# Patient Record
Sex: Male | Born: 1970 | Hispanic: No | Marital: Married | Smoking: Never smoker
Health system: Southern US, Community
[De-identification: ages and names within clinical notes are randomized; demographics above are authoritative.]

## PROBLEM LIST (undated history)

## (undated) DIAGNOSIS — E119 Type 2 diabetes mellitus without complications: Secondary | ICD-10-CM

## (undated) DIAGNOSIS — I10 Essential (primary) hypertension: Secondary | ICD-10-CM

## (undated) DIAGNOSIS — E785 Hyperlipidemia, unspecified: Secondary | ICD-10-CM

## (undated) DIAGNOSIS — K219 Gastro-esophageal reflux disease without esophagitis: Secondary | ICD-10-CM

## (undated) HISTORY — PX: HERNIA REPAIR: SHX51

## (undated) HISTORY — PX: APPENDECTOMY: SHX54

---

## 2019-06-06 ENCOUNTER — Other Ambulatory Visit: Payer: Self-pay

## 2019-06-06 ENCOUNTER — Emergency Department (HOSPITAL_BASED_OUTPATIENT_CLINIC_OR_DEPARTMENT_OTHER)
Admission: EM | Admit: 2019-06-06 | Discharge: 2019-06-06 | Disposition: A | Discharge status: Home / Self Care | Payer: BC Managed Care – PPO | Attending: Emergency Medicine | Admitting: Emergency Medicine

## 2019-06-06 ENCOUNTER — Emergency Department (HOSPITAL_BASED_OUTPATIENT_CLINIC_OR_DEPARTMENT_OTHER): Payer: BC Managed Care – PPO

## 2019-06-06 ENCOUNTER — Encounter (HOSPITAL_BASED_OUTPATIENT_CLINIC_OR_DEPARTMENT_OTHER): Payer: Self-pay | Admitting: Emergency Medicine

## 2019-06-06 DIAGNOSIS — W19XXXA Unspecified fall, initial encounter: Secondary | ICD-10-CM

## 2019-06-06 DIAGNOSIS — S299XXA Unspecified injury of thorax, initial encounter: Secondary | ICD-10-CM | POA: Insufficient documentation

## 2019-06-06 DIAGNOSIS — I10 Essential (primary) hypertension: Secondary | ICD-10-CM | POA: Diagnosis not present

## 2019-06-06 DIAGNOSIS — E785 Hyperlipidemia, unspecified: Secondary | ICD-10-CM | POA: Diagnosis not present

## 2019-06-06 DIAGNOSIS — W010XXA Fall on same level from slipping, tripping and stumbling without subsequent striking against object, initial encounter: Secondary | ICD-10-CM | POA: Diagnosis not present

## 2019-06-06 DIAGNOSIS — Y9289 Other specified places as the place of occurrence of the external cause: Secondary | ICD-10-CM | POA: Insufficient documentation

## 2019-06-06 DIAGNOSIS — Y9389 Activity, other specified: Secondary | ICD-10-CM | POA: Insufficient documentation

## 2019-06-06 DIAGNOSIS — Y998 Other external cause status: Secondary | ICD-10-CM | POA: Diagnosis not present

## 2019-06-06 HISTORY — DX: Hyperlipidemia, unspecified: E78.5

## 2019-06-06 HISTORY — DX: Gastro-esophageal reflux disease without esophagitis: K21.9

## 2019-06-06 HISTORY — DX: Essential (primary) hypertension: I10

## 2019-06-06 MED ORDER — NAPROXEN 500 MG PO TABS: 500.0000 mg | ORAL_TABLET | Freq: Two times a day (BID) | ORAL | 0 refills | Status: DC

## 2019-06-06 MED ORDER — ACETAMINOPHEN 500 MG PO TABS
1000.0000 mg | ORAL_TABLET | Freq: Once | ORAL | Status: AC
  Administered 2019-06-06: 1000 mg via ORAL
  Filled 2019-06-06: qty 2

## 2019-06-06 MED ORDER — NAPROXEN 250 MG PO TABS
500.0000 mg | ORAL_TABLET | Freq: Once | ORAL | Status: AC
  Administered 2019-06-06: 500 mg via ORAL
  Filled 2019-06-06: qty 2

## 2019-06-06 NOTE — ED Notes (Signed)
ED Provider at bedside. 

## 2019-06-06 NOTE — ED Notes (Signed)
Patient transported to X-ray 

## 2019-06-06 NOTE — ED Provider Notes (Signed)
[facility] EMERGENCY DEPARTMENT Provider Note   CSN: [id number] Arrival date & time: 06/06/19  0035     History   Chief Complaint Chief Complaint  Patient presents with  . Fall    HPI Wayne Curry is a 48 y.o. male.     The history is provided by the patient.  Fall This is a new problem. The current episode started 3 to 5 hours ago. The problem occurs constantly. The problem has not changed since onset.Pertinent negatives include no chest pain, no abdominal pain, no headaches and no shortness of breath. Associated symptoms comments: Right rib pain. Nothing aggravates the symptoms. Nothing relieves the symptoms. He has tried nothing for the symptoms. The treatment provided no relief.  Patient fell into rounded area of wall as pbaby was trying to get out of crib.  Did not hit head.  No LOC.  Right rib pain.    Past Medical History:  Diagnosis Date  . GERD (gastroesophageal reflux disease)   . Hyperlipidemia   . Hypertension     There are no active problems to display for this patient.   Past Surgical History:  Procedure Laterality Date  . APPENDECTOMY    . HERNIA REPAIR          Home Medications    Prior to Admission medications   Medication Sig Start Date End Date Taking? Authorizing Provider  esomeprazole (NEXIUM) 40 MG capsule Take 40 mg by mouth daily at 12 noon.   Yes [provider]  naproxen (NAPROSYN) 500 MG tablet Take 1 tablet (500 mg total) by mouth 2 (two) times daily with a meal. 06/06/19   Ritter Helsley, MD    Family History No family history on file.  Social History Social History   Tobacco Use  . Smoking status: Never Smoker  . Smokeless tobacco: Current User    Types: Snuff  Substance Use Topics  . Alcohol use: Never    Frequency: Never  . Drug use: Never     Allergies   Patient has no known allergies.   Review of Systems Review of Systems  Constitutional: Negative for fever.  HENT: Negative for  congestion.   Eyes: Negative for visual disturbance.  Respiratory: Negative for shortness of breath.   Cardiovascular: Negative for chest pain.  Gastrointestinal: Negative for abdominal pain.  Genitourinary: Negative for difficulty urinating.  Musculoskeletal: Positive for arthralgias.  Neurological: Negative for headaches.  Psychiatric/Behavioral: Negative for agitation.  All other systems reviewed and are negative.    Physical Exam Updated Vital Signs BP 119/82   Pulse 64   Temp 98.7 F (37.1 C) (Oral)   Resp 16   Ht 5\' 8"  (1.727 m)   Wt 77.1 kg   SpO2 98%   BMI 25.85 kg/m   Physical Exam Vitals signs and nursing note reviewed.  Constitutional:      General: He is not in acute distress.    Appearance: He is normal weight.  HENT:     Head: Normocephalic and atraumatic.     Nose: Nose normal.  Eyes:     Conjunctiva/sclera: Conjunctivae normal.     Pupils: Pupils are equal, round, and reactive to light.  Neck:     Musculoskeletal: Normal range of motion and neck supple.  Cardiovascular:     Rate and Rhythm: Normal rate and regular rhythm.     Pulses: Normal pulses.     Heart sounds: Normal heart sounds.  Pulmonary:     Effort: Pulmonary effort  is normal.     Breath sounds: Normal breath sounds.  Abdominal:     General: Abdomen is flat. Bowel sounds are normal.     Tenderness: There is no abdominal tenderness. There is no guarding.  Musculoskeletal: Normal range of motion.  Skin:    General: Skin is warm and dry.     Capillary Refill: Capillary refill takes less than 2 seconds.  Neurological:     General: No focal deficit present.     Mental Status: He is alert and oriented to person, place, and time.  Psychiatric:        Mood and Affect: Mood normal.        Behavior: Behavior normal.      ED Treatments / Results  Labs (all labs ordered are listed, but only abnormal results are displayed) Labs Reviewed - No data to display  EKG None  Radiology Dg  Ribs Unilateral W/chest Right  Result Date: 06/06/2019 CLINICAL DATA:  48 year old male with fall and right chest pain. EXAM: RIGHT RIBS AND CHEST - 3+ VIEW COMPARISON:  None. FINDINGS: The lungs are clear. There is no pleural effusion or pneumothorax. A 9 mm nodular density at the right lung base, likely nipple shadow. The cardiac silhouette is within normal limits. No acute osseous pathology. No displaced rib fractures. IMPRESSION: 1. No acute cardiopulmonary process. 2. No displaced rib fracture. Electronically Signed   By: Anner Crete M.D.   On: 06/06/2019 01:10    Procedures Procedures (including critical care time)  Medications Ordered in ED Medications  acetaminophen (TYLENOL) tablet 1,000 mg (1,000 mg Oral Given 06/06/19 0102)  naproxen (NAPROSYN) tablet 500 mg (500 mg Oral Given 06/06/19 0102)     Ice applied to ribs.  No palpable deformity.  No fracture on Xray.  Continue Ice and NSAIDs.    Final Clinical Impressions(s) / ED Diagnoses   Final diagnoses:  Fall, initial encounter   Return for intractable cough, coughing up blood,fevers >100.4 unrelieved by medication, shortness of breath, intractable vomiting, chest pain, shortness of breath, weakness,numbness, changes in speech, facial asymmetry,abdominal pain, passing out,Inability to tolerate liquids or food, cough, altered mental status or any concerns. No signs of systemic illness or infection. The patient is nontoxic-appearing on exam and vital signs are within normal limits.   I have reviewed the triage vital signs and the nursing notes. Pertinent labs &imaging results that were available during my care of the patient were reviewed by me and considered in my medical decision making (see chart for details).  After history, exam, and medical workup I feel the patient has been appropriately medically screened and is safe for discharge home. Pertinent diagnoses were discussed with the patient. Patient was given  return precautions ED Discharge Orders         Ordered    naproxen (NAPROSYN) 500 MG tablet  2 times daily with meals     06/06/19 0116           Helma Argyle, MD 06/06/19 2426

## 2019-06-06 NOTE — ED Triage Notes (Signed)
Pt fell at home and hit right side ribs on bed. Pt c/o right anterior rib pain. Pt has ice on area on arrival.

## 2021-08-16 ENCOUNTER — Emergency Department (HOSPITAL_BASED_OUTPATIENT_CLINIC_OR_DEPARTMENT_OTHER)
Admission: EM | Admit: 2021-08-16 | Discharge: 2021-08-16 | Disposition: A | Discharge status: Home / Self Care | Payer: BC Managed Care – PPO | Attending: Emergency Medicine | Admitting: Emergency Medicine

## 2021-08-16 ENCOUNTER — Other Ambulatory Visit: Payer: Self-pay

## 2021-08-16 ENCOUNTER — Encounter (HOSPITAL_BASED_OUTPATIENT_CLINIC_OR_DEPARTMENT_OTHER): Payer: Self-pay | Admitting: Emergency Medicine

## 2021-08-16 ENCOUNTER — Emergency Department (HOSPITAL_BASED_OUTPATIENT_CLINIC_OR_DEPARTMENT_OTHER): Payer: BC Managed Care – PPO

## 2021-08-16 DIAGNOSIS — J101 Influenza due to other identified influenza virus with other respiratory manifestations: Secondary | ICD-10-CM | POA: Diagnosis not present

## 2021-08-16 DIAGNOSIS — F17220 Nicotine dependence, chewing tobacco, uncomplicated: Secondary | ICD-10-CM | POA: Diagnosis not present

## 2021-08-16 DIAGNOSIS — I10 Essential (primary) hypertension: Secondary | ICD-10-CM | POA: Diagnosis not present

## 2021-08-16 DIAGNOSIS — E119 Type 2 diabetes mellitus without complications: Secondary | ICD-10-CM | POA: Diagnosis not present

## 2021-08-16 DIAGNOSIS — R509 Fever, unspecified: Secondary | ICD-10-CM | POA: Diagnosis present

## 2021-08-16 HISTORY — DX: Type 2 diabetes mellitus without complications: E11.9

## 2021-08-16 LAB — RAPID INFLUENZA A&B ANTIGENS
Influenza A (ARMC): POSITIVE — AB
Influenza B (ARMC): NEGATIVE

## 2021-08-16 MED ORDER — BENZONATATE 100 MG PO CAPS: 100.0000 mg | ORAL_CAPSULE | Freq: Three times a day (TID) | ORAL | 0 refills | Status: DC

## 2021-08-16 MED ORDER — ACETAMINOPHEN 500 MG PO TABS
1000.0000 mg | ORAL_TABLET | Freq: Once | ORAL | Status: AC | PRN
  Administered 2021-08-16: 1000 mg via ORAL
  Filled 2021-08-16: qty 2

## 2021-08-16 NOTE — ED Provider Notes (Signed)
[facility] EMERGENCY DEPARTMENT Provider Note   CSN: [id number] Arrival date & time: 08/16/21  1812     History Chief Complaint  Patient presents with   Fever    Ballard Budney is a 50 y.o. male.  Patient with history of diabetes, hypertension presents today with chief complaint of fever and cough.  He states that his symptoms have been ongoing for 3 days.  He endorses fevers at maximum 104 for which she has been taking Tylenol with success.  He also endorses associated congestion and sinus pressure.  He has not tried anything for the symptoms.  He denies chest pain, shortness of breath, or sore throat.  No known sick contacts.  The history is provided by the patient. No language interpreter was used.  Fever Associated symptoms: congestion, cough, headaches, myalgias and rhinorrhea   Associated symptoms: no chest pain, no chills, no confusion, no diarrhea, no ear pain, no nausea, no rash, no sore throat and no vomiting       Past Medical History:  Diagnosis Date   Diabetes mellitus without complication (HCC)    GERD (gastroesophageal reflux disease)    Hyperlipidemia    Hypertension     There are no problems to display for this patient.   Past Surgical History:  Procedure Laterality Date   APPENDECTOMY     HERNIA REPAIR         History reviewed. No pertinent family history.  Social History   Tobacco Use   Smoking status: Never   Smokeless tobacco: Current    Types: Snuff  Substance Use Topics   Alcohol use: Never   Drug use: Never    Home Medications Prior to Admission medications   Medication Sig Start Date End Date Taking? Authorizing Provider  esomeprazole (NEXIUM) 40 MG capsule Take 40 mg by mouth daily at 12 noon.    [provider]  naproxen (NAPROSYN) 500 MG tablet Take 1 tablet (500 mg total) by mouth 2 (two) times daily with a meal. 06/06/19   Palumbo, April, MD    Allergies    Patient has no known allergies.  Review of  Systems   Review of Systems  Constitutional:  Positive for fever. Negative for chills.  HENT:  Positive for congestion, rhinorrhea and sinus pressure. Negative for ear discharge, ear pain, mouth sores, nosebleeds, sinus pain, sore throat, trouble swallowing and voice change.   Eyes:  Negative for pain.  Respiratory:  Positive for cough and chest tightness. Negative for apnea, choking, shortness of breath, wheezing and stridor.   Cardiovascular:  Negative for chest pain.  Gastrointestinal:  Negative for diarrhea, nausea and vomiting.  Musculoskeletal:  Positive for myalgias. Negative for neck pain and neck stiffness.  Skin:  Negative for rash.  Neurological:  Positive for headaches. Negative for dizziness, tremors, seizures, syncope, facial asymmetry, speech difficulty, weakness, light-headedness and numbness.  Psychiatric/Behavioral:  Negative for confusion and decreased concentration.   All other systems reviewed and are negative.  Physical Exam Updated Vital Signs BP 111/83 (BP Location: Left Arm)   Pulse 88   Temp 99.2 F (37.3 C) (Oral)   Resp 16   Ht 5\' 8"  (1.727 m)   Wt 73.5 kg   SpO2 98%   BMI 24.63 kg/m   Physical Exam Vitals and nursing note reviewed.  Constitutional:      General: He is not in acute distress.    Appearance: Normal appearance. He is normal weight. He is not ill-appearing, toxic-appearing or diaphoretic.  Comments: Patient sitting comfortably in chair well-appearing in no apparent distress.  HENT:     Head: Normocephalic and atraumatic.     Nose: Nose normal.     Mouth/Throat:     Mouth: Mucous membranes are moist.  Eyes:     Extraocular Movements: Extraocular movements intact.     Conjunctiva/sclera: Conjunctivae normal.     Pupils: Pupils are equal, round, and reactive to light.  Cardiovascular:     Rate and Rhythm: Normal rate and regular rhythm.     Heart sounds: Normal heart sounds.  Pulmonary:     Effort: Pulmonary effort is normal.      Breath sounds: Normal breath sounds.  Abdominal:     General: Abdomen is flat.     Palpations: Abdomen is soft.  Musculoskeletal:        General: Normal range of motion.     Cervical back: Normal range of motion and neck supple.  Skin:    General: Skin is warm and dry.  Neurological:     General: No focal deficit present.     Mental Status: He is alert.  Psychiatric:        Mood and Affect: Mood normal.        Behavior: Behavior normal.    ED Results / Procedures / Treatments   Labs (all labs ordered are listed, but only abnormal results are displayed) Labs Reviewed  RAPID INFLUENZA A&B ANTIGENS - Abnormal; Notable for the following components:      Result Value   Influenza A ([facility]) POSITIVE (*)    All other components within normal limits    EKG None  Radiology DG Chest 2 View  Result Date: 08/16/2021 CLINICAL DATA:  Cough, fever and congestion times 3-4 days. Flu positive. EXAM: CHEST - 2 VIEW COMPARISON:  June 06, 2019 rib radiograph. FINDINGS: The heart size and mediastinal contours are within normal limits. No focal consolidation. No pleural effusion. No pneumothorax. The visualized skeletal structures are unremarkable. IMPRESSION: No acute cardiopulmonary process. Electronically Signed   By: Maudry Mayhew M.D.   On: 08/16/2021 19:44    Procedures Procedures   Medications Ordered in ED Medications  acetaminophen (TYLENOL) tablet 1,000 mg (1,000 mg Oral Given 08/16/21 1840)    ED Course  I have reviewed the triage vital signs and the nursing notes.  Pertinent labs & imaging results that were available during my care of the patient were reviewed by me and considered in my medical decision making (see chart for details).    MDM Rules/Calculators/A&P                         Patient positive for influenza A.  Symptoms are consistent with upper respiratory infection.  Lungs clear to auscultation in all fields, no labs or chest x-ray indicated.  He is awake, alert,  speaking in complete sentences, in no acute distress.  Fever successfully managed with Tylenol.  Discussed that antibiotics are not indicated for viral infections. Pt will be discharged with symptomatic treatment.  Verbalizes understanding and is agreeable with plan. Pt is hemodynamically stable & in NAD prior to dc.   Final Clinical Impression(s) / ED Diagnoses Final diagnoses:  Influenza A    Rx / DC Orders ED Discharge Orders          Ordered    benzonatate (TESSALON) 100 MG capsule  Every 8 hours        08/16/21 2328  An After Visit Summary was printed and given to the patient.    Vear Clock 08/16/21 2328    Vanetta Mulders, MD 08/28/21 [id number]

## 2021-08-16 NOTE — Discharge Instructions (Addendum)
You were diagnosed with influenza A in the emergency department this evening.  Her symptoms are consistent with an upper respiratory infection, antibiotics are not indicated for this.  Managed with supportive care including plenty of hydration, Tylenol/ibuprofen scheduled for fevers.  Additionally you may get Mucinex D (orange box) from behind the counter at your pharmacy for additional symptomatic treatment.   Return if development of new or worsening symptoms.

## 2021-08-16 NOTE — ED Triage Notes (Signed)
Pt arrives pov with c/o fever, cough body aches and sore throat x 3 days

## 2022-05-06 IMAGING — CR DG CHEST 2V
2 series · 2 of 2 positions shown · non-contrast
Comparison: June 06, 2019 rib radiograph.

CLINICAL DATA: Cough, fever and congestion times 3-4 days. Flu
positive.

EXAM:
CHEST - 2 VIEW

[w chest pa]
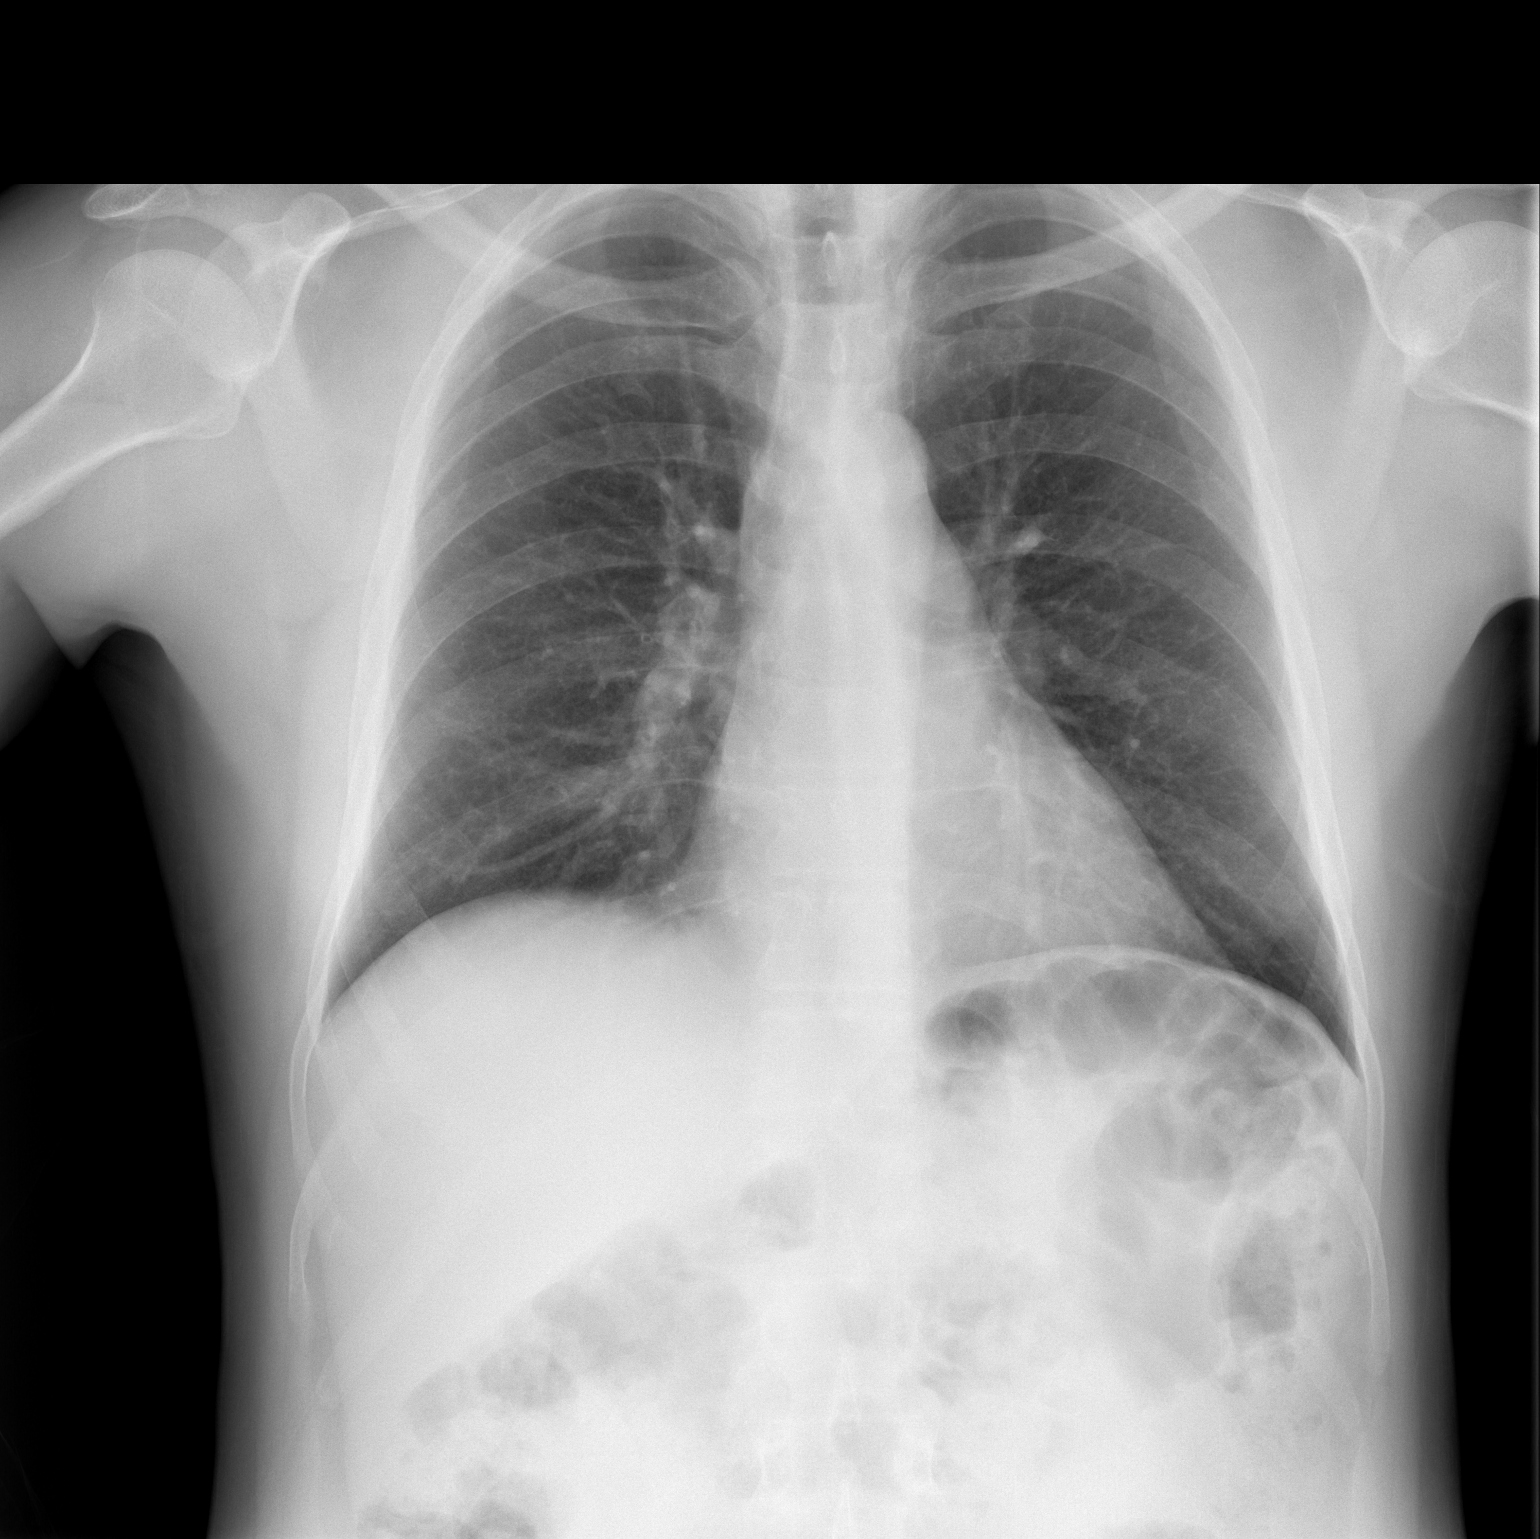

[w chest lat]
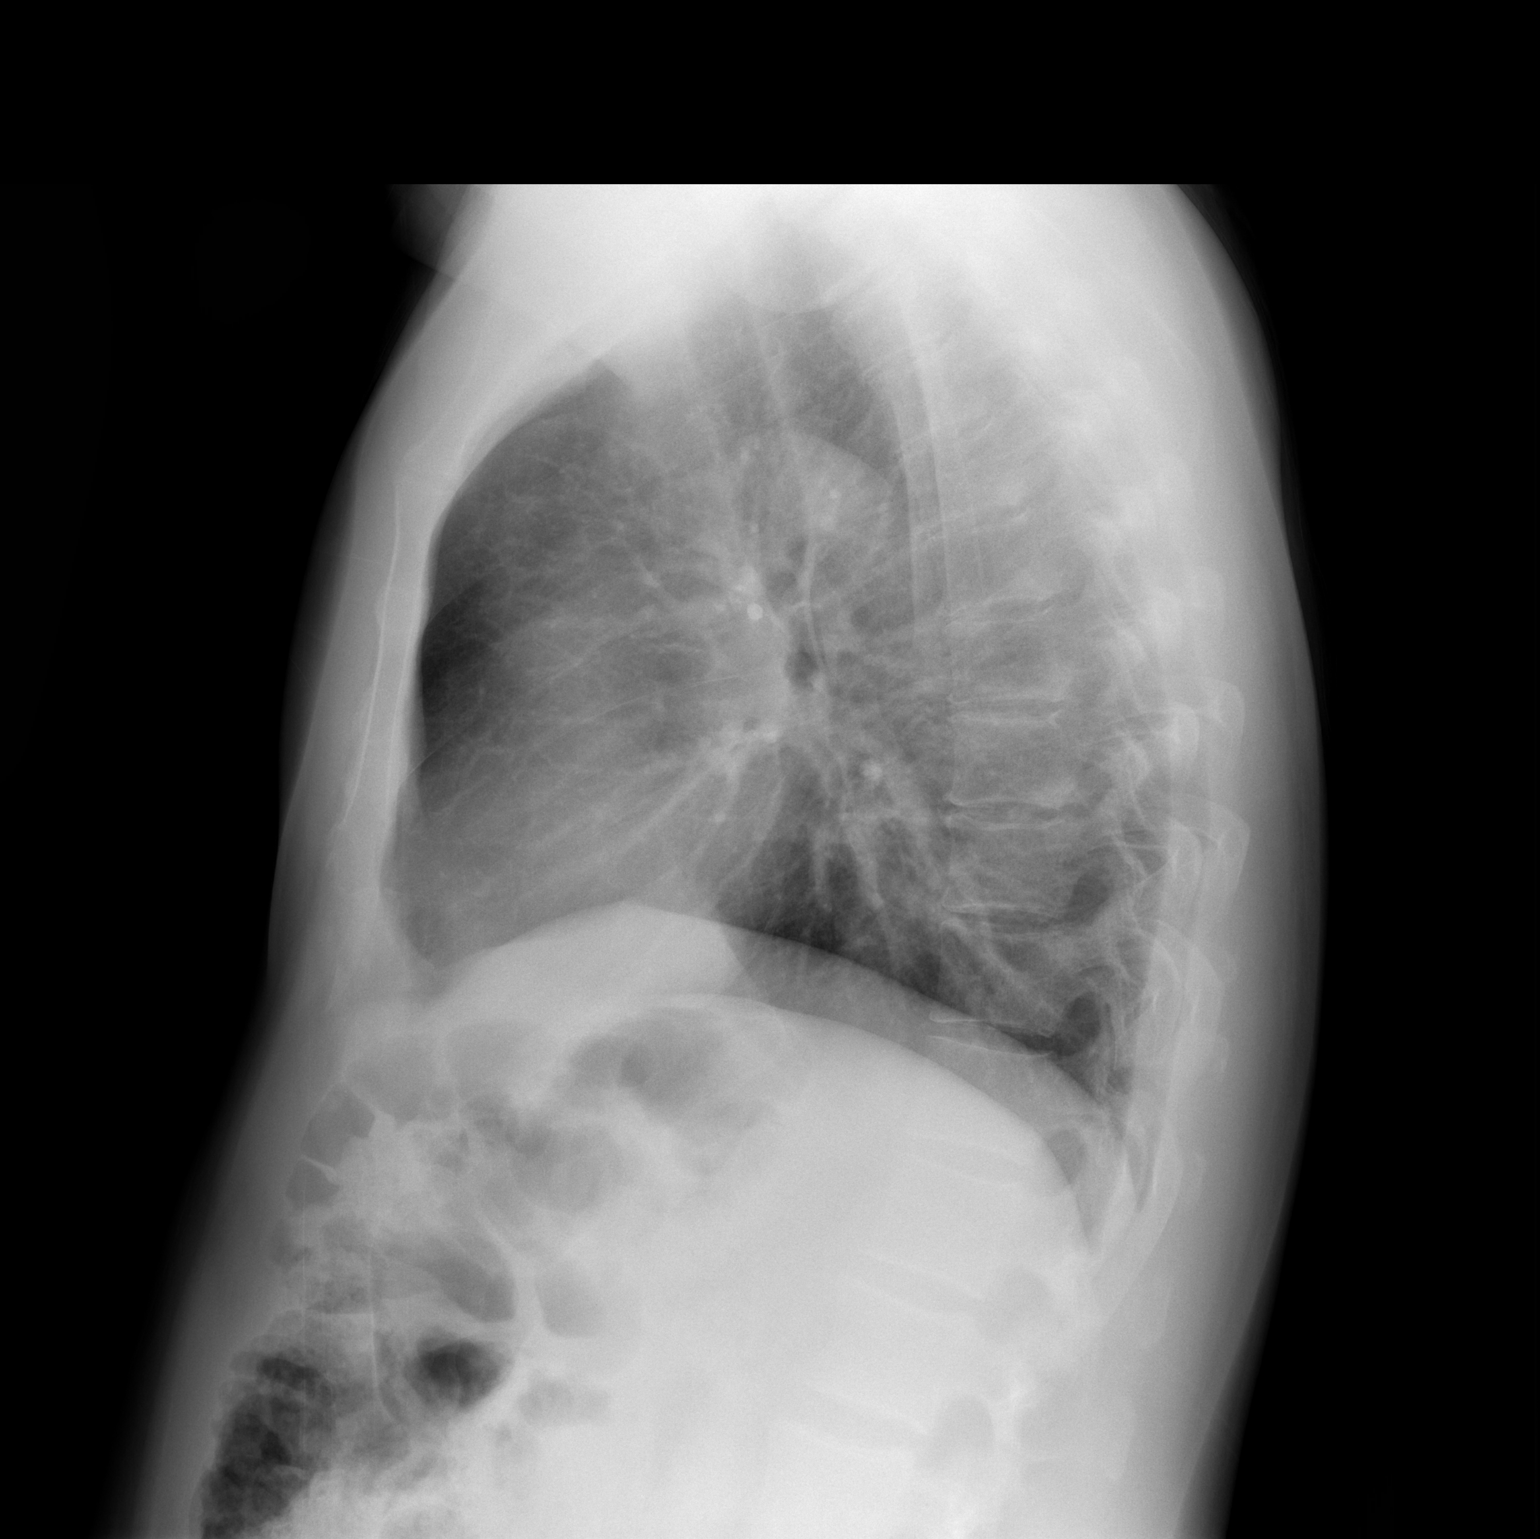

[2 of 2 positions shown; findings below may reference images not displayed]

FINDINGS: The heart size and mediastinal contours are within normal limits. No
focal consolidation. No pleural effusion. No pneumothorax. The
visualized skeletal structures are unremarkable.
IMPRESSION: No acute cardiopulmonary process.

## 2023-02-28 ENCOUNTER — Encounter (HOSPITAL_BASED_OUTPATIENT_CLINIC_OR_DEPARTMENT_OTHER): Payer: Self-pay | Admitting: Orthopaedic Surgery

## 2023-03-08 ENCOUNTER — Encounter (HOSPITAL_BASED_OUTPATIENT_CLINIC_OR_DEPARTMENT_OTHER)
Admission: RE | Admit: 2023-03-08 | Discharge: 2023-03-08 | Disposition: A | Discharge status: Home / Self Care | Payer: BC Managed Care – PPO | Source: Ambulatory Visit | Attending: Orthopaedic Surgery | Admitting: Orthopaedic Surgery

## 2023-03-08 DIAGNOSIS — E785 Hyperlipidemia, unspecified: Secondary | ICD-10-CM | POA: Diagnosis not present

## 2023-03-08 DIAGNOSIS — M62462 Contracture of muscle, left lower leg: Secondary | ICD-10-CM | POA: Diagnosis not present

## 2023-03-08 DIAGNOSIS — K219 Gastro-esophageal reflux disease without esophagitis: Secondary | ICD-10-CM | POA: Diagnosis not present

## 2023-03-08 DIAGNOSIS — Z01818 Encounter for other preprocedural examination: Secondary | ICD-10-CM | POA: Diagnosis present

## 2023-03-08 DIAGNOSIS — E119 Type 2 diabetes mellitus without complications: Secondary | ICD-10-CM | POA: Diagnosis not present

## 2023-03-08 DIAGNOSIS — I10 Essential (primary) hypertension: Secondary | ICD-10-CM | POA: Diagnosis not present

## 2023-03-08 DIAGNOSIS — Z7984 Long term (current) use of oral hypoglycemic drugs: Secondary | ICD-10-CM | POA: Diagnosis not present

## 2023-03-08 DIAGNOSIS — M722 Plantar fascial fibromatosis: Secondary | ICD-10-CM | POA: Diagnosis not present

## 2023-03-08 LAB — BASIC METABOLIC PANEL
Anion gap: 9 (ref 5–15)
BUN: 10 mg/dL (ref 6–20)
CO2: 28 mmol/L (ref 22–32)
Calcium: 9.2 mg/dL (ref 8.9–10.3)
Chloride: 100 mmol/L (ref 98–111)
Creatinine, Ser: 1.12 mg/dL (ref 0.61–1.24)
GFR, Estimated: >60 mL/min (ref >60)
Glucose, Bld: 170 mg/dL — ABNORMAL HIGH (ref 70–99)
Potassium: 4.8 mmol/L (ref 3.5–5.1)
Sodium: 137 mmol/L (ref 135–145)

## 2023-03-08 NOTE — Anesthesia Preprocedure Evaluation (Signed)
Anesthesia Evaluation  Patient identified by MRN, date of birth, ID band Patient awake    Reviewed: Allergy & Precautions, NPO status , Patient's Chart, lab work & pertinent test results  History of Anesthesia Complications Negative for: history of anesthetic complications  Airway Mallampati: II  TM Distance: >3 FB Neck ROM: Full    Dental  (+) Dental Advisory Given   Pulmonary neg pulmonary ROS   Pulmonary exam normal breath sounds clear to auscultation       Cardiovascular hypertension (HCTZ), Pt. on medications (-) angina (-) Past MI, (-) Cardiac Stents and (-) CABG (-) dysrhythmias  Rhythm:Regular Rate:Normal  HLD   Neuro/Psych negative neurological ROS     GI/Hepatic Neg liver ROS,GERD  Medicated,,  Endo/Other  diabetes, Type 2, Oral Hypoglycemic Agents    Renal/GU negative Renal ROS     Musculoskeletal   Abdominal   Peds  Hematology negative hematology ROS (+)   Anesthesia Other Findings Patient reports some weakness in the left foot. 4/5 strength with plantar and dorsiflexion.  Reproductive/Obstetrics                             Anesthesia Physical Anesthesia Plan  ASA: 2  Anesthesia Plan: General and Regional   Post-op Pain Management: Regional block* and Tylenol PO (pre-op)*   Induction: Intravenous  PONV Risk Score and Plan: 2 and Ondansetron, Dexamethasone and Treatment may vary due to age or medical condition  Airway Management Planned: LMA  Additional Equipment:   Intra-op Plan:   Post-operative Plan: Extubation in OR  Informed Consent: I have reviewed the patients History and Physical, chart, labs and discussed the procedure including the risks, benefits and alternatives for the proposed anesthesia with the patient or authorized representative who has indicated his/her understanding and acceptance.     Dental advisory given  Plan Discussed with: CRNA and  Anesthesiologist  Anesthesia Plan Comments: (Discussed potential risks of nerve blocks including, but not limited to, infection, bleeding, nerve damage, seizures, pneumothorax, respiratory depression, and potential failure of the block. Alternatives to nerve blocks discussed. All questions answered.  Risks of general anesthesia discussed including, but not limited to, sore throat, hoarse voice, chipped/damaged teeth, injury to vocal cords, nausea and vomiting, allergic reactions, lung infection, heart attack, stroke, and death. All questions answered. )        Anesthesia Quick Evaluation

## 2023-03-08 NOTE — H&P (Signed)
ORTHOPAEDIC SURGERY H&P  Subjective:  The patient presents with left plantar fasciitis.   Past Medical History:  Diagnosis Date   Diabetes mellitus without complication (HCC)    GERD (gastroesophageal reflux disease)    Hyperlipidemia    Hypertension     Past Surgical History:  Procedure Laterality Date   APPENDECTOMY     HERNIA REPAIR       (Not in an outpatient encounter)    No Known Allergies  Social History   Socioeconomic History   Marital status: Married    Spouse name: Not on file   Number of children: Not on file   Years of education: Not on file   Highest education level: Not on file  Occupational History   Not on file  Tobacco Use   Smoking status: Never   Smokeless tobacco: Current    Types: Snuff  Substance and Sexual Activity   Alcohol use: Never   Drug use: Never   Sexual activity: Not on file  Other Topics Concern   Not on file  Social History Narrative   Not on file   Social Determinants of Health   Financial Resource Strain: Not on file  Food Insecurity: Not on file  Transportation Needs: Not on file  Physical Activity: Not on file  Stress: Not on file  Social Connections: Not on file  Intimate Partner Violence: Not on file     History reviewed. No pertinent family history.   Review of Systems Pertinent items are noted in HPI.  Objective: Vital signs in last 24 hours:    02/28/2023    2:41 PM 08/16/2021   11:36 PM 08/16/2021    9:55 PM  Vitals with BMI  Height 5\' 8"     Weight 164 lbs    BMI 24.94    Systolic  129 111  Diastolic  88 83  Pulse  88 88      EXAM: General: Well nourished, well developed. Awake, alert and oriented to time, place, person. Normal mood and affect. No apparent distress. Breathing room air.  Operative Lower Extremity: Alignment - Neutral Deformity - None Skin intact Tenderness to palpation - left plantar fascia 5/5 TA, PT, GS, Per, EHL, FHL Sensation intact to light touch  throughout Palpable DP and PT pulses Special testing: None  The contralateral foot/ankle was examined for comparison and noted to be neurovascularly intact with no localized deformity, swelling, or tenderness.  Imaging Review All images taken were independently reviewed by me.  Assessment/Plan: The clinical and radiographic findings were reviewed and discussed at length with the patient.  The patient has left plantar fasciitis.  We spoke at length about the natural course of these findings. We discussed nonoperative and operative treatment options in detail.  The risks and benefits were presented and reviewed. The risks due to implant failure/irritation, infection, stiffness, nerve/vessel/tendon injury, wound healing issues, failure of this surgery, need for further surgery, thromboembolic events, amputation, death among others were discussed. The patient acknowledged the explanation and agreed to proceed with the plan.  Wayne Curry  Orthopaedic Surgery [facility]

## 2023-03-08 NOTE — Discharge Instructions (Signed)
Wayne Ramanathan, MD [facility]  Please read the following information regarding your care after surgery.  Medications  You only need a prescription for the narcotic pain medicine (ex. oxycodone, Percocet, Norco).  All of the other medicines listed below are available over the counter. ? Aleve 2 pills twice a day for the first 3 days after surgery. ? acetominophen (Tylenol) 650 mg every 4-6 hours as you need for minor to moderate pain ? oxycodone as prescribed for severe pain  ? To help prevent blood clots, take aspirin (81 mg) twice daily for 42 days after surgery (or total duration of nonweightbearing).  You should also get up every hour while you are awake to move around.  Weight Bearing ? Do NOT bear any weight on the operated leg or foot. This means do NOT touch your surgical leg to the ground!  Cast / Splint / Dressing ? If you have a splint, do NOT remove this. Keep your splint, cast or dressing clean and dry.  Don't put anything (coat hanger, pencil, etc) down inside of it.  If it gets wet, call the office immediately to schedule an appointment for a cast change.  Swelling IMPORTANT: It is normal for you to have swelling where you had surgery. To reduce swelling and pain, keep at least 3 pillows under your leg so that your toes are above your nose and your heel is above the level of your hip.  It may be necessary to keep your foot or leg elevated for several weeks.  This is critical to helping your incisions heal and your pain to feel better.  Follow Up Call my office at [phone] when you are discharged from the hospital or surgery center to schedule an appointment to be seen 7-10 days after surgery.  Call my office at [phone] if you develop a fever >101.5 F, nausea, vomiting, bleeding from the surgical site or severe pain.     

## 2023-03-09 ENCOUNTER — Other Ambulatory Visit: Payer: Self-pay

## 2023-03-09 ENCOUNTER — Ambulatory Visit (HOSPITAL_BASED_OUTPATIENT_CLINIC_OR_DEPARTMENT_OTHER)
Admission: RE | Admit: 2023-03-09 | Discharge: 2023-03-09 | Disposition: A | Discharge status: Home / Self Care | Payer: No Typology Code available for payment source | Attending: Orthopaedic Surgery | Admitting: Orthopaedic Surgery

## 2023-03-09 ENCOUNTER — Ambulatory Visit (HOSPITAL_BASED_OUTPATIENT_CLINIC_OR_DEPARTMENT_OTHER): Payer: No Typology Code available for payment source | Admitting: Anesthesiology

## 2023-03-09 ENCOUNTER — Encounter (HOSPITAL_BASED_OUTPATIENT_CLINIC_OR_DEPARTMENT_OTHER)
Admission: RE | Disposition: A | Discharge status: Home / Self Care | Payer: Self-pay | Source: Home / Self Care | Attending: Orthopaedic Surgery

## 2023-03-09 ENCOUNTER — Encounter (HOSPITAL_BASED_OUTPATIENT_CLINIC_OR_DEPARTMENT_OTHER): Payer: Self-pay | Admitting: Orthopaedic Surgery

## 2023-03-09 DIAGNOSIS — M722 Plantar fascial fibromatosis: Secondary | ICD-10-CM | POA: Diagnosis not present

## 2023-03-09 DIAGNOSIS — E119 Type 2 diabetes mellitus without complications: Secondary | ICD-10-CM | POA: Diagnosis not present

## 2023-03-09 DIAGNOSIS — I10 Essential (primary) hypertension: Secondary | ICD-10-CM | POA: Insufficient documentation

## 2023-03-09 DIAGNOSIS — Z7984 Long term (current) use of oral hypoglycemic drugs: Secondary | ICD-10-CM

## 2023-03-09 DIAGNOSIS — M62462 Contracture of muscle, left lower leg: Secondary | ICD-10-CM | POA: Insufficient documentation

## 2023-03-09 DIAGNOSIS — K219 Gastro-esophageal reflux disease without esophagitis: Secondary | ICD-10-CM | POA: Insufficient documentation

## 2023-03-09 DIAGNOSIS — Z01818 Encounter for other preprocedural examination: Secondary | ICD-10-CM

## 2023-03-09 DIAGNOSIS — E785 Hyperlipidemia, unspecified: Secondary | ICD-10-CM | POA: Insufficient documentation

## 2023-03-09 HISTORY — PX: GASTROCNEMIUS RECESSION: SHX863

## 2023-03-09 HISTORY — PX: FASCIECTOMY: SHX6525

## 2023-03-09 LAB — GLUCOSE, CAPILLARY
Glucose-Capillary: 145 mg/dL — ABNORMAL HIGH (ref 70–99)
Glucose-Capillary: 143 mg/dL — ABNORMAL HIGH (ref 70–99)

## 2023-03-09 SURGERY — FASCIECTOMY, PALM
Anesthesia: Regional | Site: Leg Lower | Laterality: Left

## 2023-03-09 MED ORDER — VANCOMYCIN HCL 500 MG IV SOLR
INTRAVENOUS | Status: AC
  Filled 2023-03-09: qty 10

## 2023-03-09 MED ORDER — LACTATED RINGERS IV SOLN: INTRAVENOUS | Status: DC

## 2023-03-09 MED ORDER — ONDANSETRON HCL 4 MG/2ML IJ SOLN
INTRAMUSCULAR | Status: DC | PRN
  Administered 2023-03-09: 4 mg via INTRAVENOUS

## 2023-03-09 MED ORDER — CEFAZOLIN SODIUM-DEXTROSE 2-4 GM/100ML-% IV SOLN
INTRAVENOUS | Status: AC
  Filled 2023-03-09: qty 100

## 2023-03-09 MED ORDER — FENTANYL CITRATE (PF) 100 MCG/2ML IJ SOLN
INTRAMUSCULAR | Status: AC
  Filled 2023-03-09: qty 2

## 2023-03-09 MED ORDER — ACETAMINOPHEN 500 MG PO TABS
ORAL_TABLET | ORAL | Status: AC
  Filled 2023-03-09: qty 2

## 2023-03-09 MED ORDER — POVIDONE-IODINE 10 % EX SWAB
CUTANEOUS | Status: DC | PRN
  Administered 2023-03-09: 1 via TOPICAL

## 2023-03-09 MED ORDER — FENTANYL CITRATE (PF) 100 MCG/2ML IJ SOLN
INTRAMUSCULAR | Status: DC | PRN
  Administered 2023-03-09: 50 ug via INTRAVENOUS

## 2023-03-09 MED ORDER — PROPOFOL 10 MG/ML IV BOLUS
INTRAVENOUS | Status: DC | PRN
  Administered 2023-03-09: 20 mg via INTRAVENOUS
  Administered 2023-03-09: 180 mg via INTRAVENOUS

## 2023-03-09 MED ORDER — LIDOCAINE 2% (20 MG/ML) 5 ML SYRINGE
INTRAMUSCULAR | Status: AC
  Filled 2023-03-09: qty 5

## 2023-03-09 MED ORDER — 0.9 % SODIUM CHLORIDE (POUR BTL) OPTIME
TOPICAL | Status: DC | PRN
  Administered 2023-03-09: 1000 mL

## 2023-03-09 MED ORDER — AMISULPRIDE (ANTIEMETIC) 5 MG/2ML IV SOLN: 10.0000 mg | Freq: Once | INTRAVENOUS | Status: DC | PRN

## 2023-03-09 MED ORDER — BUPIVACAINE HCL (PF) 0.25 % IJ SOLN
INTRAMUSCULAR | Status: AC
  Filled 2023-03-09: qty 30

## 2023-03-09 MED ORDER — DEXAMETHASONE SODIUM PHOSPHATE 10 MG/ML IJ SOLN
INTRAMUSCULAR | Status: DC | PRN
  Administered 2023-03-09: 5 mg via INTRAVENOUS

## 2023-03-09 MED ORDER — MIDAZOLAM HCL 2 MG/2ML IJ SOLN
INTRAMUSCULAR | Status: AC
  Filled 2023-03-09: qty 2

## 2023-03-09 MED ORDER — MIDAZOLAM HCL 2 MG/2ML IJ SOLN
2.0000 mg | Freq: Once | INTRAMUSCULAR | Status: AC
  Administered 2023-03-09: 2 mg via INTRAVENOUS

## 2023-03-09 MED ORDER — OXYCODONE HCL 5 MG/5ML PO SOLN: 5.0000 mg | Freq: Once | ORAL | Status: DC | PRN

## 2023-03-09 MED ORDER — CHLORHEXIDINE GLUCONATE 4 % EX SOLN: 60.0000 mL | Freq: Once | CUTANEOUS | Status: DC

## 2023-03-09 MED ORDER — DEXAMETHASONE SODIUM PHOSPHATE 10 MG/ML IJ SOLN
INTRAMUSCULAR | Status: AC
  Filled 2023-03-09: qty 1

## 2023-03-09 MED ORDER — PROPOFOL 500 MG/50ML IV EMUL
INTRAVENOUS | Status: AC
  Filled 2023-03-09: qty 50

## 2023-03-09 MED ORDER — LIDOCAINE HCL (CARDIAC) PF 100 MG/5ML IV SOSY
PREFILLED_SYRINGE | INTRAVENOUS | Status: DC | PRN
  Administered 2023-03-09: 80 mg via INTRAVENOUS

## 2023-03-09 MED ORDER — ACETAMINOPHEN 500 MG PO TABS
1000.0000 mg | ORAL_TABLET | Freq: Once | ORAL | Status: AC
  Administered 2023-03-09: 1000 mg via ORAL

## 2023-03-09 MED ORDER — ROPIVACAINE HCL 5 MG/ML IJ SOLN
INTRAMUSCULAR | Status: DC | PRN
  Administered 2023-03-09: 25 mL via PERINEURAL
  Administered 2023-03-09: 15 mL via PERINEURAL

## 2023-03-09 MED ORDER — OXYCODONE HCL 5 MG PO TABS: 5.0000 mg | ORAL_TABLET | Freq: Once | ORAL | Status: DC | PRN

## 2023-03-09 MED ORDER — ONDANSETRON HCL 4 MG/2ML IJ SOLN
INTRAMUSCULAR | Status: AC
  Filled 2023-03-09: qty 2

## 2023-03-09 MED ORDER — CEFAZOLIN SODIUM-DEXTROSE 2-4 GM/100ML-% IV SOLN
2.0000 g | INTRAVENOUS | Status: AC
  Administered 2023-03-09: 2 g via INTRAVENOUS

## 2023-03-09 MED ORDER — VANCOMYCIN HCL 500 MG IV SOLR
INTRAVENOUS | Status: DC | PRN
  Administered 2023-03-09: 500 mg via TOPICAL

## 2023-03-09 MED ORDER — FENTANYL CITRATE (PF) 100 MCG/2ML IJ SOLN: 25.0000 ug | INTRAMUSCULAR | Status: DC | PRN

## 2023-03-09 MED ORDER — LIDOCAINE HCL (PF) 1 % IJ SOLN
INTRAMUSCULAR | Status: AC
  Filled 2023-03-09: qty 30

## 2023-03-09 SURGICAL SUPPLY — 76 items
APL PRP STRL LF DISP 70% ISPRP (MISCELLANEOUS) ×3
BANDAGE ESMARK 6X9 LF (GAUZE/BANDAGES/DRESSINGS) ×3 IMPLANT
BLADE AVERAGE 25X9 (BLADE) IMPLANT
BLADE MICRO SAGITTAL (BLADE) IMPLANT
BLADE SURG 15 STRL LF DISP TIS (BLADE) ×6 IMPLANT
BLADE SURG 15 STRL SS (BLADE) ×6
BNDG CMPR 5X4 CHSV STRCH STRL (GAUZE/BANDAGES/DRESSINGS) ×3
BNDG CMPR 5X4 KNIT ELC UNQ LF (GAUZE/BANDAGES/DRESSINGS) ×6
BNDG CMPR 5X62 HK CLSR LF (GAUZE/BANDAGES/DRESSINGS) ×3
BNDG CMPR 6"X 5 YARDS HK CLSR (GAUZE/BANDAGES/DRESSINGS) ×3
BNDG CMPR 9X6 STRL LF SNTH (GAUZE/BANDAGES/DRESSINGS) ×3
BNDG COHESIVE 4X5 TAN STRL LF (GAUZE/BANDAGES/DRESSINGS) ×3 IMPLANT
BNDG ELASTIC 4INX 5YD STR LF (GAUZE/BANDAGES/DRESSINGS) ×8 IMPLANT
BNDG ELASTIC 6INX 5YD STR LF (GAUZE/BANDAGES/DRESSINGS) ×3 IMPLANT
BNDG ESMARK 6X9 LF (GAUZE/BANDAGES/DRESSINGS) ×3
BNDG GAUZE DERMACEA FLUFF 4 (GAUZE/BANDAGES/DRESSINGS) ×3 IMPLANT
BNDG GZE DERMACEA 4 6PLY (GAUZE/BANDAGES/DRESSINGS) ×3
CANISTER SUCT 1200ML W/VALVE (MISCELLANEOUS) ×3 IMPLANT
CHLORAPREP W/TINT 26 (MISCELLANEOUS) ×3 IMPLANT
COVER BACK TABLE 60X90IN (DRAPES) ×3 IMPLANT
CUFF TOURN SGL QUICK 34 (TOURNIQUET CUFF) ×3
CUFF TRNQT CYL 34X4.125X (TOURNIQUET CUFF) ×3 IMPLANT
DRAPE EXTREMITY T 121X128X90 (DISPOSABLE) ×3 IMPLANT
DRAPE IMP U-DRAPE 54X76 (DRAPES) ×3 IMPLANT
DRAPE OEC MINIVIEW 54X84 (DRAPES) IMPLANT
DRAPE U-SHAPE 47X51 STRL (DRAPES) ×3 IMPLANT
DRSG MEPITEL 4X7.2 (GAUZE/BANDAGES/DRESSINGS) ×3 IMPLANT
ELECT REM PT RETURN 9FT ADLT (ELECTROSURGICAL) ×3
ELECTRODE REM PT RTRN 9FT ADLT (ELECTROSURGICAL) ×3 IMPLANT
GAUZE PAD ABD 8X10 STRL (GAUZE/BANDAGES/DRESSINGS) ×15 IMPLANT
GAUZE SPONGE 4X4 12PLY STRL (GAUZE/BANDAGES/DRESSINGS) ×3 IMPLANT
GLOVE BIOGEL PI IND STRL 8 (GLOVE) ×3 IMPLANT
GLOVE SURG SS PI 7.5 STRL IVOR (GLOVE) ×6 IMPLANT
GOWN STRL REUS W/ TWL LRG LVL3 (GOWN DISPOSABLE) ×6 IMPLANT
GOWN STRL REUS W/TWL LRG LVL3 (GOWN DISPOSABLE) ×6
NDL HYPO 25X1 1.5 SAFETY (NEEDLE) IMPLANT
NDL SUT 6 .5 CRC .975X.05 MAYO (NEEDLE) IMPLANT
NEEDLE HYPO 25X1 1.5 SAFETY (NEEDLE) IMPLANT
NEEDLE MAYO TAPER (NEEDLE)
PACK BASIN DAY SURGERY FS (CUSTOM PROCEDURE TRAY) ×3 IMPLANT
PAD CAST 4YDX4 CTTN HI CHSV (CAST SUPPLIES) ×2 IMPLANT
PADDING CAST ABS COTTON 4X4 ST (CAST SUPPLIES) IMPLANT
PADDING CAST COTTON 4X4 STRL (CAST SUPPLIES)
PADDING CAST SYNTHETIC 4X4 STR (CAST SUPPLIES) ×9 IMPLANT
PENCIL SMOKE EVACUATOR (MISCELLANEOUS) ×3 IMPLANT
SANITIZER HAND PURELL FF 515ML (MISCELLANEOUS) ×3 IMPLANT
SHEET MEDIUM DRAPE 40X70 STRL (DRAPES) ×3 IMPLANT
SLEEVE SCD COMPRESS KNEE MED (STOCKING) ×3 IMPLANT
SPLINT FIBERGLASS 4X30 (CAST SUPPLIES) IMPLANT
SPLINT PLASTER CAST FAST 5X30 (CAST SUPPLIES) ×60 IMPLANT
SPONGE T-LAP 18X18 ~~LOC~~+RFID (SPONGE) ×3 IMPLANT
STOCKINETTE 6  STRL (DRAPES) ×3
STOCKINETTE 6 STRL (DRAPES) ×3 IMPLANT
SUCTION FRAZIER HANDLE 10FR (MISCELLANEOUS) ×3
SUCTION TUBE FRAZIER 10FR DISP (MISCELLANEOUS) ×3 IMPLANT
SUT ETHILON 2 0 FS 18 (SUTURE) ×6 IMPLANT
SUT FIBERWIRE #2 38 T-5 BLUE (SUTURE)
SUT MNCRL AB 3-0 PS2 18 (SUTURE) ×2 IMPLANT
SUT VIC AB 0 CT1 27 (SUTURE)
SUT VIC AB 0 CT1 27XBRD ANBCTR (SUTURE) IMPLANT
SUT VIC AB 1 CT1 27 (SUTURE)
SUT VIC AB 1 CT1 27XBRD ANBCTR (SUTURE) IMPLANT
SUT VIC AB 2-0 SH 27 (SUTURE) ×3
SUT VIC AB 2-0 SH 27XBRD (SUTURE) ×3 IMPLANT
SUT VIC AB 3-0 SH 27 (SUTURE)
SUT VIC AB 3-0 SH 27X BRD (SUTURE) ×2 IMPLANT
SUT VICRYL 0 SH 27 (SUTURE) ×3 IMPLANT
SUTURE FIBERWR #2 38 T-5 BLUE (SUTURE) IMPLANT
SUTURE TAPE 1.3 FIBERLOP 20 ST (SUTURE) IMPLANT
SUTURETAPE 1.3 FIBERLOOP 20 ST (SUTURE)
SYR BULB EAR ULCER 3OZ GRN STR (SYRINGE) ×3 IMPLANT
SYR CONTROL 10ML LL (SYRINGE) IMPLANT
TOWEL GREEN STERILE FF (TOWEL DISPOSABLE) ×6 IMPLANT
TUBE CONNECTING 20X1/4 (TUBING) ×3 IMPLANT
UNDERPAD 30X36 HEAVY ABSORB (UNDERPADS AND DIAPERS) ×3 IMPLANT
YANKAUER SUCT BULB TIP NO VENT (SUCTIONS) IMPLANT

## 2023-03-09 NOTE — H&P (Signed)
H&P Update:  -History and Physical Reviewed  -Patient has been re-examined  -No change in the plan of care  -The risks and benefits were presented and reviewed. The risks due to suture failure and/or irritation, new/persistent infection, stiffness, nerve/vessel/tendon injury or rerupture of repaired tendon, nonunion/malunion, allograft usage, wound healing issues, development of arthritis, failure of this surgery, possibility of external fixation with delayed definitive surgery, need for further surgery, thromboembolic events, anesthesia/medical complications, amputation, death among others were discussed. The patient acknowledged the explanation, agreed to proceed with the plan and a consent was signed.  Wayne Curry

## 2023-03-09 NOTE — Progress Notes (Signed)
Assisted Dr. Jennifer Allan with left, adductor canal, popliteal, ultrasound guided block. Side rails up, monitors on throughout procedure. See vital signs in flow sheet. Tolerated Procedure well. 

## 2023-03-09 NOTE — Anesthesia Procedure Notes (Signed)
Anesthesia Regional Block: Popliteal block   Pre-Anesthetic Checklist: , timeout performed,  Correct Patient, Correct Site, Correct Laterality,  Correct Procedure, Correct Position, site marked,  Risks and benefits discussed,  Surgical consent,  Pre-op evaluation,  At surgeon's request and post-op pain management  Laterality: Left  Prep: chloraprep       Needles:  Injection technique: Single-shot  Needle Type: Echogenic Stimulator Needle     Needle Length: 9cm  Needle Gauge: 21     Additional Needles:   Procedures:,,,, ultrasound used (permanent image in chart),,    Narrative:  Start time: 03/09/2023 6:55 AM End time: 03/09/2023 6:58 AM Injection made incrementally with aspirations every 5 mL.  Performed by: Personally  Anesthesiologist: Linton Rump, MD  Additional Notes: Patient with pre-block 4/5 strength with plantar and dorsiflexion.  Discussed risks and benefits of nerve block including, but not limited to, prolonged and/or permanent nerve injury involving sensory and/or motor function. Monitors were applied and a time-out was performed. The nerve and associated structures were visualized under ultrasound guidance. After negative aspiration, local anesthetic was slowly injected around the nerve. There was no evidence of high pressure during the procedure. There were no paresthesias. VSS remained stable and the patient tolerated the procedure well.

## 2023-03-09 NOTE — Anesthesia Procedure Notes (Signed)
Anesthesia Regional Block: Adductor canal block   Pre-Anesthetic Checklist: , timeout performed,  Correct Patient, Correct Site, Correct Laterality,  Correct Procedure, Correct Position, site marked,  Risks and benefits discussed,  Surgical consent,  Pre-op evaluation,  At surgeon's request and post-op pain management  Laterality: Left  Prep: chloraprep       Needles:  Injection technique: Single-shot  Needle Type: Echogenic Stimulator Needle     Needle Length: 9cm  Needle Gauge: 21     Additional Needles:   Procedures:,,,, ultrasound used (permanent image in chart),,    Narrative:  Start time: 03/09/2023 6:59 AM End time: 03/09/2023 7:01 AM Injection made incrementally with aspirations every 5 mL.  Performed by: Personally  Anesthesiologist: Linton Rump, MD  Additional Notes: Discussed risks and benefits of nerve block including, but not limited to, prolonged and/or permanent nerve injury involving sensory and/or motor function. Monitors were applied and a time-out was performed. The nerve and associated structures were visualized under ultrasound guidance. After negative aspiration, local anesthetic was slowly injected around the nerve. There was no evidence of high pressure during the procedure. There were no paresthesias. VSS remained stable and the patient tolerated the procedure well.

## 2023-03-09 NOTE — Anesthesia Postprocedure Evaluation (Signed)
Anesthesia Post Note  Patient: Wayne Curry  Procedure(s) Performed: Left plantar fasciectomy (Left: Foot) GASTROCNEMIUS RECESSION (Left: Leg Lower)     Patient location during evaluation: PACU Anesthesia Type: Regional and General Level of consciousness: awake Pain management: pain level controlled Vital Signs Assessment: post-procedure vital signs reviewed and stable Respiratory status: spontaneous breathing, nonlabored ventilation and respiratory function stable Cardiovascular status: blood pressure returned to baseline and stable Postop Assessment: no apparent nausea or vomiting Anesthetic complications: no   No notable events documented.  Last Vitals:  Vitals:   03/09/23 0900 03/09/23 0910  BP: (!) 131/94 (!) 135/94  Pulse: 74 75  Resp: 13 14  Temp:  (!) 36.1 C  SpO2: 100% 99%    Last Pain:  Vitals:   03/09/23 0849  TempSrc:   PainSc: 0-No pain                 Linton Rump

## 2023-03-09 NOTE — Op Note (Signed)
03/09/2023  5:04 PM   PATIENT: Wayne Curry  52 y.o. male  MRN: [id number]   PRE-OPERATIVE DIAGNOSIS:   Plantar fasciitis of left foot with gastrocnemius contracture   POST-OPERATIVE DIAGNOSIS:   Same   PROCEDURE: 1] Left plantar fasciectomy 2] Left gastrocnemius recession   SURGEON:  Netta Cedars, MD   ASSISTANT: None   ANESTHESIA: General, regional   EBL: Minimal   TOURNIQUET:    Total Tourniquet Time Documented: Thigh (Left) - 27 minutes Total: Thigh (Left) - 27 minutes    COMPLICATIONS: None apparent   DISPOSITION: Extubated, awake and stable to recovery.   INDICATION FOR PROCEDURE: The patient presented with above diagnosis.  We discussed the diagnosis, alternative treatment options, risks and benefits of the above surgical intervention, as well as alternative non-operative treatments. All questions/concerns were addressed and the patient/family demonstrated appropriate understanding of the diagnosis, the procedure, the postoperative course, and overall prognosis. The patient wished to proceed with surgical intervention and signed an informed surgical consent as such, in each others presence prior to surgery.   PROCEDURE IN DETAIL: After preoperative consent was obtained and the correct operative site was identified, the patient was brought to the operating room supine on stretcher and transferred onto operating table. General anesthesia was induced. Preoperative antibiotics were administered. Surgical timeout was taken. The patient was then positioned supine with an ipsilateral hip bump. Preoperative exam demonstrated positive Silfverskiold confirming gastrocnemius contracture. The operative lower extremity was prepped and draped in standard sterile fashion with a tourniquet around the thigh. The extremity was exsanguinated and the tourniquet was inflated to 275 mmHg.  We then made a limited plantar incision 2 cm distal to the origin of the plantar  fascia to perform the plantar fasciectomy. Dissection was carefully carried down to the level of the plantar fascia taking care to protected nerve branches. We then used a freer elevator to define the medial and central portions of the plantar fascia. A fresh 15 blade was used to perform a plantar fasciectomy with excellent relaxation of the inflamed thickened fascia in this region.   A Silfverskiold test was performed again under anesthesia and the patient was noted to have gastrocnemius tightness. Therefore, a gastrocnemius recession was performed. We made a longitudinal incision along the medial aspect of the mid leg. Dissection carefully carried down to the level of the gastrocnemius fascia. The recession was performed while protecting neurovascular structures and excellent improvement in ankle ROM was noted.   The surgical sites were thoroughly irrigated. The tourniquet was deflated and hemostasis achieved. Betadine and vancomycin powder were applied. The deep layers were closed using 2-0 vicryl. The skin was closed without tension using 2-0 nylon suture.    The leg was cleaned with saline and sterile dressings with gauze were applied. A well padded bulky short leg splint was applied. The patient was awakened from anesthesia and transported to the recovery room in stable condition.    FOLLOW UP PLAN: -transfer to PACU, then home -strict NWB operative extremity, maximum elevation -maintain short leg splint until follow up -DVT ppx: Aspirin 81 mg twice daily while NWB -follow up as outpatient within 7-10 days for wound check with exchange of short leg splint to short leg cast -sutures out in 2-3 weeks in outpatient office   RADIOGRAPHS: None used.   Netta Cedars [facility]

## 2023-03-09 NOTE — Transfer of Care (Signed)
Immediate Anesthesia Transfer of Care Note  Patient: Wayne Curry  Procedure(s) Performed: Left plantar fasciectomy (Left: Foot) GASTROCNEMIUS RECESSION (Left: Leg Lower)  Patient Location: PACU  Anesthesia Type:GA combined with regional for post-op pain  Level of Consciousness: sedated  Airway & Oxygen Therapy: Patient Spontanous Breathing and Patient connected to face mask oxygen  Post-op Assessment: Report given to RN and Post -op Vital signs reviewed and stable  Post vital signs: Reviewed and stable  Last Vitals:  Vitals Value Taken Time  BP 133/91 03/09/23 0830  Temp    Pulse 62 03/09/23 0832  Resp 10 03/09/23 0832  SpO2 100 % 03/09/23 0832  Vitals shown include unvalidated device data.  Last Pain:  Vitals:   03/09/23 0634  TempSrc: Oral  PainSc: 7       Patients Stated Pain Goal: 7 (03/09/23 1610)  Complications: No notable events documented.

## 2023-03-09 NOTE — Anesthesia Procedure Notes (Signed)
Procedure Name: LMA Insertion Date/Time: 03/09/2023 7:39 AM  Performed by: Burna Cash, CRNAPre-anesthesia Checklist: Patient identified, Emergency Drugs available, Suction available and Patient being monitored Patient Re-evaluated:Patient Re-evaluated prior to induction Oxygen Delivery Method: Circle system utilized Preoxygenation: Pre-oxygenation with 100% oxygen Induction Type: IV induction Ventilation: Mask ventilation without difficulty LMA: LMA inserted LMA Size: 4.0 Number of attempts: 1 Airway Equipment and Method: Bite block Placement Confirmation: positive ETCO2 Tube secured with: Tape Dental Injury: Teeth and Oropharynx as per pre-operative assessment

## 2023-03-10 ENCOUNTER — Encounter (HOSPITAL_BASED_OUTPATIENT_CLINIC_OR_DEPARTMENT_OTHER): Payer: Self-pay | Admitting: Orthopaedic Surgery

## 2023-03-10 ENCOUNTER — Other Ambulatory Visit: Payer: Self-pay
